# Patient Record
Sex: Female | Born: 2000 | Hispanic: No | Marital: Single | Smoking: Never smoker
Health system: Southern US, Community
[De-identification: ages and names within clinical notes are randomized; demographics above are authoritative.]

## PROBLEM LIST (undated history)

## (undated) DIAGNOSIS — F419 Anxiety disorder, unspecified: Secondary | ICD-10-CM

## (undated) DIAGNOSIS — E781 Pure hyperglyceridemia: Secondary | ICD-10-CM

## (undated) DIAGNOSIS — E860 Dehydration: Secondary | ICD-10-CM

## (undated) HISTORY — DX: Pure hyperglyceridemia: E78.1

## (undated) HISTORY — DX: Anxiety disorder, unspecified: F41.9

## (undated) HISTORY — DX: Dehydration: E86.0

---
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
## 2023-02-21 ENCOUNTER — Encounter: Payer: Self-pay | Admitting: Family Medicine

## 2023-02-21 ENCOUNTER — Ambulatory Visit (INDEPENDENT_AMBULATORY_CARE_PROVIDER_SITE_OTHER): Payer: Self-pay | Admitting: Family Medicine

## 2023-02-21 VITALS — BP 106/69 | HR 76 | Ht 64.0 in | Wt 151.0 lb

## 2023-02-21 DIAGNOSIS — R7301 Impaired fasting glucose: Secondary | ICD-10-CM

## 2023-02-21 DIAGNOSIS — E039 Hypothyroidism, unspecified: Secondary | ICD-10-CM

## 2023-02-21 DIAGNOSIS — Z1159 Encounter for screening for other viral diseases: Secondary | ICD-10-CM

## 2023-02-21 DIAGNOSIS — Z114 Encounter for screening for human immunodeficiency virus [HIV]: Secondary | ICD-10-CM

## 2023-02-21 DIAGNOSIS — F419 Anxiety disorder, unspecified: Secondary | ICD-10-CM | POA: Insufficient documentation

## 2023-02-21 DIAGNOSIS — E785 Hyperlipidemia, unspecified: Secondary | ICD-10-CM

## 2023-02-21 MED ORDER — ESCITALOPRAM OXALATE 5 MG PO TABS: 5.0000 mg | ORAL_TABLET | Freq: Every day | ORAL | 3 refills | Status: DC

## 2023-02-21 MED ORDER — HYDROXYZINE HCL 10 MG PO TABS: 10.0000 mg | ORAL_TABLET | Freq: Three times a day (TID) | ORAL | 1 refills | Status: DC | PRN

## 2023-02-21 NOTE — Patient Instructions (Signed)
It was pleasure meeting with you today. Please take medications as prescribed. Follow up with your primary health provider if any health concerns arises. If symptoms worsen please contact your primary care provider and/or visit the emergency department.  

## 2023-02-21 NOTE — Assessment & Plan Note (Addendum)
    02/21/2023    2:17 PM  GAD 7 : Generalized Anxiety Score  Nervous, Anxious, on Edge 1  Control/stop worrying 1  Worry too much - different things 1  Trouble relaxing 0  Restless 1  Easily annoyed or irritable 1  Afraid - awful might happen 0  Total GAD 7 Score 5  Anxiety Difficulty Somewhat difficult  Prescribed Lexapro 5 mg daily as initial therapy and hydrxyzine 10 mg PRN Discussed about cognitive behavioral therapy focusing on thoughts, belief, and attitudes that affects feelings and behavior, learning about coping skills to deal with certain problems. Maintaining a consistent routine and schedule, Practice stress management and self calming techniques, excersise regularly and spend time outdoors, Do not eat food that are high in fat, added sugar, or salt. Follow up in 6 weeks Patient requested Referral to behavior health

## 2023-02-21 NOTE — Progress Notes (Signed)
New Patient Office Visit   Subjective   Patient ID: Linda Russell, female    DOB: 24-Apr-2001  Age: 22 y.o. MRN: [id number]  CC:  Chief Complaint  Patient presents with   Establish Care    HPI [facility] 22 year old female, presents to establish care. She  has no past medical history on file.  Anxiety Onset was 1 to 5 years ago. The problem has been gradually worsening. Symptoms include decreased concentration, depressed mood, excessive worry, insomnia, irritability and nervous/anxious behavior. Patient reports no chest pain, dizziness, panic, shortness of breath or suicidal ideas. Symptoms occur most days. The severity of symptoms is moderate. The symptoms are aggravated by family issues, work stress and social activities. The patient sleeps 7 hours per night. The quality of sleep is fair. Nighttime awakenings: none. Risk factors include family history. Her past medical history is significant for anxiety/panic attacks. Patient reports no prior medication intervention.    Outpatient Encounter Medications as of 02/21/2023  Medication Sig   escitalopram (LEXAPRO) 5 MG tablet Take 1 tablet (5 mg total) by mouth daily.   hydrOXYzine (ATARAX) 10 MG tablet Take 1 tablet (10 mg total) by mouth 3 (three) times daily as needed.   No facility-administered encounter medications on file as of 02/21/2023.    History reviewed. No pertinent surgical history.  Review of Systems  Constitutional:  Negative for chills and fever.  Respiratory:  Negative for cough.   Musculoskeletal:  Negative for myalgias.  Neurological:  Negative for headaches.      Objective    BP 106/69   Pulse 76   Ht '5\' 4"'$  (1.626 m)   Wt 151 lb (68.5 kg)   SpO2 98%   BMI 25.92 kg/m   Physical Exam Cardiovascular:     Rate and Rhythm: Normal rate and regular rhythm.     Pulses: Normal pulses.     Heart sounds: Normal heart sounds.  Pulmonary:     Effort: Pulmonary effort is normal. No  respiratory distress.     Breath sounds: Normal breath sounds.  Musculoskeletal:        General: Normal range of motion.     Cervical back: Normal range of motion and neck supple.  Skin:    General: Skin is warm and dry.     Capillary Refill: Capillary refill takes less than 2 seconds.  Neurological:     General: No focal deficit present.     Mental Status: She is alert.     Coordination: Coordination normal.     Gait: Gait normal.  Psychiatric:        Mood and Affect: Mood normal.        Thought Content: Thought content normal.       Assessment & Plan:  Anxiety Assessment & Plan:    02/21/2023    2:17 PM  GAD 7 : Generalized Anxiety Score  Nervous, Anxious, on Edge 1  Control/stop worrying 1  Worry too much - different things 1  Trouble relaxing 0  Restless 1  Easily annoyed or irritable 1  Afraid - awful might happen 0  Total GAD 7 Score 5  Anxiety Difficulty Somewhat difficult  Prescribed Lexapro 5 mg daily as initial therapy and hydrxyzine 10 mg PRN Discussed about cognitive behavioral therapy focusing on thoughts, belief, and attitudes that affects feelings and behavior, learning about coping skills to deal with certain problems. Maintaining a consistent routine and schedule, Practice stress management and self  calming techniques, excersise regularly and spend time outdoors, Do not eat food that are high in fat, added sugar, or salt. Follow up in 6 weeks Patient requested Referral to behavior health    Orders: -     Escitalopram Oxalate; Take 1 tablet (5 mg total) by mouth daily.  Dispense: 30 tablet; Refill: 3 -     hydrOXYzine HCl; Take 1 tablet (10 mg total) by mouth 3 (three) times daily as needed.  Dispense: 30 tablet; Refill: 1 -     Amb ref to Integrated Behavioral Health  Need for hepatitis C screening test -     Hepatitis C antibody  Screening for HIV (human immunodeficiency virus) -     HIV Antibody (routine testing w rflx)  Hypothyroidism,  unspecified type -     TSH + free T4  Hyperlipidemia, unspecified hyperlipidemia type -     Lipid panel -     CMP14+EGFR -     CBC with Differential/Platelet  IFG (impaired fasting glucose) -     Hemoglobin A1c    Return in about 6 weeks (around 04/04/2023) for Anxiety.   Renard Hamper Ria Comment, FNP

## 2023-02-22 LAB — LIPID PANEL
Chol/HDL Ratio: 3.7 ratio (ref 0.0–4.4)
Cholesterol, Total: 164 mg/dL (ref 100–169)
HDL: 44 mg/dL (ref >39)
LDL Chol Calc (NIH): 102 mg/dL (ref 0–109)
Triglycerides: 100 mg/dL — ABNORMAL HIGH (ref 0–89)
VLDL Cholesterol Cal: 18 mg/dL (ref 5–40)

## 2023-02-22 LAB — CBC WITH DIFFERENTIAL/PLATELET
Basophils Absolute: 0.1 10*3/uL (ref 0.0–0.2)
Basos: 1 %
EOS (ABSOLUTE): 0.1 10*3/uL (ref 0.0–0.4)
Eos: 1 %
Hematocrit: 44.6 % (ref 34.0–46.6)
Hemoglobin: 14.6 g/dL (ref 11.1–15.9)
Immature Grans (Abs): 0 10*3/uL (ref 0.0–0.1)
Immature Granulocytes: 0 %
Lymphocytes Absolute: 2.7 10*3/uL (ref 0.7–3.1)
Lymphs: 30 %
MCH: 27.8 pg (ref 26.6–33.0)
MCHC: 32.7 g/dL (ref 31.5–35.7)
MCV: 85 fL (ref 79–97)
Monocytes Absolute: 0.5 10*3/uL (ref 0.1–0.9)
Monocytes: 6 %
Neutrophils Absolute: 5.4 10*3/uL (ref 1.4–7.0)
Neutrophils: 62 %
Platelets: 401 10*3/uL (ref 150–450)
RBC: 5.25 x10E6/uL (ref 3.77–5.28)
RDW: 12.7 % (ref 11.7–15.4)
WBC: 8.7 10*3/uL (ref 3.4–10.8)

## 2023-02-22 LAB — CMP14+EGFR
ALT: 23 IU/L (ref 0–32)
AST: 19 IU/L (ref 0–40)
Albumin/Globulin Ratio: 2 (ref 1.2–2.2)
Albumin: 5.1 g/dL — ABNORMAL HIGH (ref 4.0–5.0)
Alkaline Phosphatase: 101 IU/L (ref 42–106)
BUN/Creatinine Ratio: 8 — ABNORMAL LOW (ref 9–23)
BUN: 5 mg/dL — ABNORMAL LOW (ref 6–20)
Bilirubin Total: 0.3 mg/dL (ref 0.0–1.2)
CO2: 23 mmol/L (ref 20–29)
Calcium: 10 mg/dL (ref 8.7–10.2)
Chloride: 101 mmol/L (ref 96–106)
Creatinine, Ser: 0.6 mg/dL (ref 0.57–1.00)
Globulin, Total: 2.5 g/dL (ref 1.5–4.5)
Glucose: 92 mg/dL (ref 70–99)
Potassium: 4.9 mmol/L (ref 3.5–5.2)
Sodium: 139 mmol/L (ref 134–144)
Total Protein: 7.6 g/dL (ref 6.0–8.5)
eGFR: 133 mL/min/{1.73_m2} (ref >59)

## 2023-02-22 LAB — HEPATITIS C ANTIBODY: Hep C Virus Ab: NONREACTIVE

## 2023-02-22 LAB — TSH+FREE T4
Free T4: 1.38 ng/dL (ref 0.93–1.60)
TSH: 0.802 u[IU]/mL (ref 0.450–4.500)

## 2023-02-22 LAB — HEMOGLOBIN A1C
Est. average glucose Bld gHb Est-mCnc: 105 mg/dL
Hgb A1c MFr Bld: 5.3 % (ref 4.8–5.6)

## 2023-02-22 LAB — HIV ANTIBODY (ROUTINE TESTING W REFLEX): HIV Screen 4th Generation wRfx: NONREACTIVE

## 2023-03-15 ENCOUNTER — Ambulatory Visit (INDEPENDENT_AMBULATORY_CARE_PROVIDER_SITE_OTHER): Payer: Medicaid Other | Admitting: Licensed Clinical Social Worker

## 2023-03-15 DIAGNOSIS — F419 Anxiety disorder, unspecified: Secondary | ICD-10-CM

## 2023-03-19 NOTE — BH Specialist Note (Signed)
Integrated Behavioral Health via Telemedicine Visit  03/19/2023 Linda Russell [id number]  Number of Integrated Behavioral Health Clinician visits: 1 Session Start time:  10:30am Session End time: 11:03am Total time in minutes: 33 mins via mychart video   Referring Provider: Noberto Retort NP  Patient/Family location: Home  [facility] Provider location: [facility]  All persons participating in visit: Linda A Torres LCSW A Felton Clinton  Types of Service: General Behavioral Integrated Care (BHI)  I connected with Linda Russell and/or Linda Russell's n/a via  Telephone or Engineer, civil (consulting)  (Video is Surveyor, mining) and verified that I am speaking with the correct person using two identifiers. Discussed confidentiality: Yes   I discussed the limitations of telemedicine and the availability of in person appointments.  Discussed there is a possibility of technology failure and discussed alternative modes of communication if that failure occurs.  I discussed that engaging in this telemedicine visit, they consent to the provision of behavioral healthcare and the services will be billed under their insurance.  Patient and/or legal guardian expressed understanding and consented to Telemedicine visit: Yes   Presenting Concerns: Patient and/or family reports the following symptoms/concerns: general anxiety disorder Duration of problem: over one year ; Severity of problem: mild  Patient and/or Family's Strengths/Protective Factors: Concrete supports in place (healthy food, safe environments, etc.)  Goals Addressed: Patient will:  Reduce symptoms of: anxiety   Increase knowledge and/or ability of: coping skills   Demonstrate ability to: Increase healthy adjustment to current life circumstances  Progress towards Goals: Ongoing  Interventions: Interventions utilized:  Motivational Interviewing and Supportive Counseling Standardized Assessments  completed: n/a  Patient and/or Family Response: Ms. Linda Russell reports social isolation, depressed mood, difficulty starting tasks,  and low motivation.    Assessment: Patient currently experiencing anxiety.   Patient may benefit from integrated behavioral health.  Plan: Follow up with behavioral health clinician on : 2-3 weeks Behavioral recommendations: engage in peer and social groups, write down and prioritize tasks.  Referral(s): Integrated [employer] (In Clinic)  I discussed the assessment and treatment plan with the patient and/or parent/guardian. They were provided an opportunity to ask questions and all were answered. They agreed with the plan and demonstrated an understanding of the instructions.   They were advised to call back or seek an in-person evaluation if the symptoms worsen or if the condition fails to improve as anticipated.  Linda Saxon, LCSW

## 2023-04-04 ENCOUNTER — Ambulatory Visit: Payer: Medicaid Other | Admitting: Family Medicine

## 2023-04-04 ENCOUNTER — Encounter: Payer: Self-pay | Admitting: Family Medicine

## 2023-04-04 VITALS — BP 110/76 | HR 95 | Ht 64.0 in | Wt 152.0 lb

## 2023-04-04 DIAGNOSIS — F419 Anxiety disorder, unspecified: Secondary | ICD-10-CM | POA: Diagnosis not present

## 2023-04-04 MED ORDER — ESCITALOPRAM OXALATE 10 MG PO TABS: 10.0000 mg | ORAL_TABLET | Freq: Every day | ORAL | 2 refills | Status: DC

## 2023-04-04 MED ORDER — HYDROXYZINE PAMOATE 25 MG PO CAPS: 25.0000 mg | ORAL_CAPSULE | Freq: Three times a day (TID) | ORAL | 0 refills | Status: AC | PRN

## 2023-04-04 NOTE — Progress Notes (Signed)
Patient Office Visit   Subjective   Patient ID: Linda Russell, female    DOB: 2001-06-09  Age: 22 y.o. MRN: [id number]  CC:  Chief Complaint  Patient presents with   Anxiety    Unchanged.     HPI Linda Russell 22 year old female, presents to to the clinic for anxiety follow up. She  has a past medical history of Anxiety, Dehydration, and High triglycerides.For the details of today's visit, please refer to assessment and plan.   HPI    Outpatient Encounter Medications as of 04/04/2023  Medication Sig   escitalopram (LEXAPRO) 10 MG tablet Take 1 tablet (10 mg total) by mouth daily.   hydrOXYzine (VISTARIL) 25 MG capsule Take 1 capsule (25 mg total) by mouth every 8 (eight) hours as needed.   [DISCONTINUED] escitalopram (LEXAPRO) 5 MG tablet Take 1 tablet (5 mg total) by mouth daily.   [DISCONTINUED] hydrOXYzine (ATARAX) 10 MG tablet Take 1 tablet (10 mg total) by mouth 3 (three) times daily as needed.   No facility-administered encounter medications on file as of 04/04/2023.    History reviewed. No pertinent surgical history.  Review of Systems  Constitutional:  Negative for chills and fever.  HENT:  Negative for hearing loss.   Respiratory:  Negative for cough.   Cardiovascular:  Negative for chest pain.  Gastrointestinal:  Negative for abdominal pain and vomiting.  Genitourinary:  Negative for dysuria.  Musculoskeletal:  Negative for myalgias.  Neurological:  Negative for dizziness and headaches.  Psychiatric/Behavioral:  The patient is nervous/anxious and has insomnia.       Objective    BP 110/76   Pulse 95   Ht  (1.626 m)   Wt 152 lb (68.9 kg)   SpO2 97%   BMI 26.09 kg/m   Physical Exam Vitals reviewed.  Constitutional:      General: She is not in acute distress.    Appearance: Normal appearance. She is not ill-appearing, toxic-appearing or diaphoretic.  HENT:     Head: Normocephalic.  Eyes:     General:        Right eye: No  discharge.        Left eye: No discharge.     Conjunctiva/sclera: Conjunctivae normal.  Cardiovascular:     Rate and Rhythm: Normal rate.     Pulses: Normal pulses.     Heart sounds: Normal heart sounds.  Pulmonary:     Effort: Pulmonary effort is normal. No respiratory distress.     Breath sounds: Normal breath sounds.  Abdominal:     General: Bowel sounds are normal.     Palpations: Abdomen is soft.     Tenderness: There is no abdominal tenderness. There is no right CVA tenderness, left CVA tenderness or guarding.  Musculoskeletal:        General: Normal range of motion.     Cervical back: Normal range of motion.  Skin:    General: Skin is warm and dry.     Capillary Refill: Capillary refill takes less than 2 seconds.  Neurological:     General: No focal deficit present.     Mental Status: She is alert and oriented to person, place, and time.     Coordination: Coordination normal.     Gait: Gait normal.  Psychiatric:        Mood and Affect: Mood normal.        Behavior: Behavior normal.  Thought Content: Thought content normal.        Judgment: Judgment normal.       Assessment & Plan:  Anxiety Assessment & Plan:    04/04/2023    2:02 PM 02/21/2023    2:17 PM  GAD 7 : Generalized Anxiety Score  Nervous, Anxious, on Edge 1 1  Control/stop worrying 0 1  Worry too much - different things 1 1  Trouble relaxing 0 0  Restless 1 1  Easily annoyed or irritable 1 1  Afraid - awful might happen 0 0  Total GAD 7 Score 4 5  Anxiety Difficulty  Somewhat difficult  Patient reported anxiety medication not working  Increased Lexapro 10 mg and Hydroxyzine 25 mg PRN  Follow up in 6 weeks Continued discussion on lifestyle changes, establishing a daily routine, going outdoors, exercise, healthy eating habits, mindfulness and mediatation.     Orders: -     Escitalopram Oxalate; Take 1 tablet (10 mg total) by mouth daily.  Dispense: 30 tablet; Refill: 2 -     hydrOXYzine  Pamoate; Take 1 capsule (25 mg total) by mouth every 8 (eight) hours as needed.  Dispense: 30 capsule; Refill: 0    Return in about 6 weeks (around 05/16/2023) for Anxiety.   Cruzita Lederer Newman Nip, FNP

## 2023-04-04 NOTE — Patient Instructions (Signed)

## 2023-04-04 NOTE — Assessment & Plan Note (Addendum)
    04/04/2023    2:02 PM 02/21/2023    2:17 PM  GAD 7 : Generalized Anxiety Score  Nervous, Anxious, on Edge 1 1  Control/stop worrying 0 1  Worry too much - different things 1 1  Trouble relaxing 0 0  Restless 1 1  Easily annoyed or irritable 1 1  Afraid - awful might happen 0 0  Total GAD 7 Score 4 5  Anxiety Difficulty  Somewhat difficult  Patient reported anxiety medication not working  Increased Lexapro 10 mg and Hydroxyzine 25 mg PRN  Follow up in 6 weeks Continued discussion on lifestyle changes, establishing a daily routine, going outdoors, exercise, healthy eating habits, mindfulness and mediatation.

## 2023-05-10 ENCOUNTER — Telehealth: Payer: Self-pay

## 2023-05-10 NOTE — Telephone Encounter (Signed)
LVM, also sending mychart msg. AS, CMA 

## 2023-05-16 ENCOUNTER — Encounter: Payer: Self-pay | Admitting: Family Medicine

## 2023-05-16 ENCOUNTER — Ambulatory Visit: Payer: Medicaid Other | Admitting: Family Medicine

## 2023-05-16 VITALS — BP 110/64 | HR 103 | Ht 64.0 in | Wt 158.0 lb

## 2023-05-16 DIAGNOSIS — F339 Major depressive disorder, recurrent, unspecified: Secondary | ICD-10-CM

## 2023-05-16 DIAGNOSIS — F419 Anxiety disorder, unspecified: Secondary | ICD-10-CM

## 2023-05-16 DIAGNOSIS — R5383 Other fatigue: Secondary | ICD-10-CM

## 2023-05-16 MED ORDER — ESCITALOPRAM OXALATE 20 MG PO TABS: 20.0000 mg | ORAL_TABLET | Freq: Every day | ORAL | 2 refills | Status: DC

## 2023-05-16 MED ORDER — BUPROPION HCL ER (XL) 150 MG PO TB24: 150.0000 mg | ORAL_TABLET | Freq: Every day | ORAL | 2 refills | Status: DC

## 2023-05-16 NOTE — Progress Notes (Signed)
Patient Office Visit   Subjective   Patient ID: Linda Russell, female    DOB: [birth year]  Age: 22 y.o. MRN: [id number]  CC:  Chief Complaint  Patient presents with   Anxiety    Patient is here for anxiety f/u. States she feels the same since last visit.     HPI Linda Russell 22 year old female, presents to the clinic for follow up anxiety and depression. She  has a past medical history of Anxiety, Dehydration, and High triglycerides.For the details of today's visit, please refer to assessment and plan.   HPI    Outpatient Encounter Medications as of 05/16/2023  Medication Sig   buPROPion (WELLBUTRIN XL) 150 MG 24 hr tablet Take 1 tablet (150 mg total) by mouth daily.   escitalopram (LEXAPRO) 20 MG tablet Take 1 tablet (20 mg total) by mouth daily.   hydrOXYzine (VISTARIL) 25 MG capsule Take 1 capsule (25 mg total) by mouth every 8 (eight) hours as needed.   [DISCONTINUED] escitalopram (LEXAPRO) 10 MG tablet Take 1 tablet (10 mg total) by mouth daily.   No facility-administered encounter medications on file as of 05/16/2023.    No past surgical history on file.  Review of Systems  Constitutional:  Negative for chills and fever.  Eyes:  Negative for blurred vision.  Respiratory:  Negative for shortness of breath.   Cardiovascular:  Negative for chest pain.  Gastrointestinal:  Negative for abdominal pain.  Genitourinary:  Negative for dysuria.  Neurological:  Negative for dizziness.  Psychiatric/Behavioral:  Positive for depression. The patient is nervous/anxious.       Objective    BP 110/64   Pulse (!) 103   Ht 5\' 4"  (1.626 m)   Wt 158 lb (71.7 kg)   SpO2 96%   BMI 27.12 kg/m   Physical Exam Vitals reviewed.  Constitutional:      General: She is not in acute distress.    Appearance: Normal appearance. She is not ill-appearing, toxic-appearing or diaphoretic.  HENT:     Head: Normocephalic.  Eyes:     General:        Right eye: No  discharge.        Left eye: No discharge.     Conjunctiva/sclera: Conjunctivae normal.  Cardiovascular:     Rate and Rhythm: Normal rate.     Pulses: Normal pulses.     Heart sounds: Normal heart sounds.  Pulmonary:     Effort: Pulmonary effort is normal. No respiratory distress.     Breath sounds: Normal breath sounds.  Musculoskeletal:        General: Normal range of motion.     Cervical back: Normal range of motion.  Skin:    General: Skin is warm and dry.     Capillary Refill: Capillary refill takes less than 2 seconds.  Neurological:     General: No focal deficit present.     Mental Status: She is alert and oriented to person, place, and time.     Coordination: Coordination normal.     Gait: Gait normal.  Psychiatric:        Mood and Affect: Mood normal.        Behavior: Behavior normal.       Assessment & Plan:  Other fatigue -     VITAMIN D 25 Hydroxy (Vit-D Deficiency, Fractures) -     Vitamin B12 -     Folate  Depression, recurrent (HCC) Assessment & Plan:  Flowsheet Row Office Visit from 05/16/2023 in [facility]  PHQ-9 Total Score 9     Started Wellbutrin 150 mg daily Discussed non pharmacological interventions such seeing a therapist, support system, diet, exercise, sleep. Patient verbalizes understanding regarding plan of care and all questions answered.    Anxiety Assessment & Plan:    05/16/2023   10:56 AM 04/04/2023    2:02 PM 02/21/2023    2:17 PM  GAD 7 : Generalized Anxiety Score  Nervous, Anxious, on Edge 2 1 1   Control/stop worrying 0 0 1  Worry too much - different things 1 1 1   Trouble relaxing 0 0 0  Restless 2 1 1   Easily annoyed or irritable 1 1 1   Afraid - awful might happen 1 0 0  Total GAD 7 Score 7 4 5   Anxiety Difficulty Somewhat difficult  Somewhat difficult  Patient reported has not seen a difference for her anxiety symptoms reported taking lexapro 10 mg daily  Increased lexapro 20 mg daily Discussed about  cognitive behavioral therapy focusing on thoughts, belief, and attitudes that affects feelings and behavior, learning about coping skills to deal with certain problems. Maintaining a consistent routine and schedule, Practice stress management and self calming techniques, excersise regularly and spend time outdoors, Do not eat food that are high in fat, added sugar, or salt. Follow up in 8 weeks    Other orders -     Escitalopram Oxalate; Take 1 tablet (20 mg total) by mouth daily.  Dispense: 30 tablet; Refill: 2 -     buPROPion HCl ER (XL); Take 1 tablet (150 mg total) by mouth daily.  Dispense: 30 tablet; Refill: 2    Return in about 8 weeks (around 07/11/2023) for Anxiety.   Cruzita Lederer Newman Nip, FNP

## 2023-05-16 NOTE — Assessment & Plan Note (Signed)
    05/16/2023   10:56 AM 04/04/2023    2:02 PM 02/21/2023    2:17 PM  GAD 7 : Generalized Anxiety Score  Nervous, Anxious, on Edge 2 1 1   Control/stop worrying 0 0 1  Worry too much - different things 1 1 1   Trouble relaxing 0 0 0  Restless 2 1 1   Easily annoyed or irritable 1 1 1   Afraid - awful might happen 1 0 0  Total GAD 7 Score 7 4 5   Anxiety Difficulty Somewhat difficult  Somewhat difficult  Patient reported has not seen a difference for her anxiety symptoms reported taking lexapro 10 mg daily  Increased lexapro 20 mg daily Discussed about cognitive behavioral therapy focusing on thoughts, belief, and attitudes that affects feelings and behavior, learning about coping skills to deal with certain problems. Maintaining a consistent routine and schedule, Practice stress management and self calming techniques, excersise regularly and spend time outdoors, Do not eat food that are high in fat, added sugar, or salt. Follow up in 8 weeks

## 2023-05-16 NOTE — Patient Instructions (Addendum)
        Great to see you today.   - Please take medications as prescribed. - Follow up with your primary health provider if any health concerns arises. - If symptoms worsen please contact your primary care provider and/or visit the emergency department.  

## 2023-05-16 NOTE — Assessment & Plan Note (Addendum)
Flowsheet Row Office Visit from 05/16/2023 in [facility]  PHQ-9 Total Score 9     Started Wellbutrin 150 mg daily Discussed non pharmacological interventions such seeing a therapist, support system, diet, exercise, sleep. Patient verbalizes understanding regarding plan of care and all questions answered.

## 2023-05-17 LAB — VITAMIN B12: Vitamin B-12: 521 pg/mL (ref 232–1245)

## 2023-05-17 LAB — VITAMIN D 25 HYDROXY (VIT D DEFICIENCY, FRACTURES): Vit D, 25-Hydroxy: 13.6 ng/mL — ABNORMAL LOW (ref 30.0–100.0)

## 2023-05-17 LAB — FOLATE: Folate: 12.2 ng/mL (ref >3.0)

## 2023-06-25 ENCOUNTER — Encounter: Payer: Medicaid Other | Admitting: Family Medicine

## 2023-07-11 ENCOUNTER — Encounter: Payer: Self-pay | Admitting: Family Medicine

## 2023-07-11 ENCOUNTER — Ambulatory Visit: Payer: Medicaid Other | Admitting: Family Medicine

## 2023-07-11 VITALS — BP 116/68 | HR 100 | Ht 64.0 in | Wt 152.0 lb

## 2023-07-11 DIAGNOSIS — F419 Anxiety disorder, unspecified: Secondary | ICD-10-CM | POA: Diagnosis not present

## 2023-07-11 DIAGNOSIS — F339 Major depressive disorder, recurrent, unspecified: Secondary | ICD-10-CM

## 2023-07-11 DIAGNOSIS — L509 Urticaria, unspecified: Secondary | ICD-10-CM | POA: Insufficient documentation

## 2023-07-11 MED ORDER — EPINEPHRINE 0.3 MG/0.3ML IJ SOAJ: 0.3000 mg | INTRAMUSCULAR | 3 refills | Status: AC | PRN

## 2023-07-11 NOTE — Assessment & Plan Note (Addendum)
Patient reported Hives with swelling and itchiness several episodes around 3-4 times a year. Last episode in July 2024 - Unknown Etiology Patient stated is unaware if she has allergies to any substance. Referral to Allergy Services Advice Epi-Pen PRN if warning symptoms of SOB, chest pain, swelling, dizziness.

## 2023-07-11 NOTE — Assessment & Plan Note (Addendum)
Flowsheet Row Office Visit from 07/11/2023 in [facility]  PHQ-9 Total Score 10     Patient reports symptoms controlled on Wellbutrin 150 mg daily Continued discussion on lifestyle changes, establishing a daily routine, going outdoors, exercise, healthy eating habits, mindfulness and mediatation.

## 2023-07-11 NOTE — Patient Instructions (Signed)
        Great to see you today.   - Please take medications as prescribed. - Follow up with your primary health provider if any health concerns arises. - If symptoms worsen please contact your primary care provider and/or visit the emergency department.  

## 2023-07-11 NOTE — Assessment & Plan Note (Addendum)
    07/11/2023    9:25 AM 05/16/2023   10:56 AM 04/04/2023    2:02 PM 02/21/2023    2:17 PM  GAD 7 : Generalized Anxiety Score  Nervous, Anxious, on Edge 2 2 1 1   Control/stop worrying 1 0 0 1  Worry too much - different things 1 1 1 1   Trouble relaxing 2 0 0 0  Restless 2 2 1 1   Easily annoyed or irritable 1 1 1 1   Afraid - awful might happen 0 1 0 0  Total GAD 7 Score 9 7 4 5   Anxiety Difficulty Somewhat difficult Somewhat difficult  Somewhat difficult  Patient reported symptoms controlled on Lexapro 20 mg once daily. Continue current medication regimen

## 2023-07-11 NOTE — Progress Notes (Signed)
Patient Office Visit   Subjective   Patient ID: Linda Russell, female    DOB: [birth year]  Age: 22 y.o. MRN: [id number]  CC:  Chief Complaint  Patient presents with   Depression   Anxiety    Patient is here for f/u of anxiety and depression. No changes or concerns since last visit.     HPI Linda Russell 22 year old female, presents to the clinic for anxiety and depression follow up. She  has a past medical history of Anxiety, Dehydration, and High triglycerides.For the details of today's visit, please refer to assessment and plan.   HPI    Outpatient Encounter Medications as of 07/11/2023  Medication Sig   buPROPion (WELLBUTRIN XL) 150 MG 24 hr tablet Take 1 tablet (150 mg total) by mouth daily.   EPINEPHrine 0.3 mg/0.3 mL IJ SOAJ injection Inject 0.3 mg into the muscle as needed for anaphylaxis.   escitalopram (LEXAPRO) 20 MG tablet Take 1 tablet (20 mg total) by mouth daily.   hydrOXYzine (VISTARIL) 25 MG capsule Take 1 capsule (25 mg total) by mouth every 8 (eight) hours as needed.   No facility-administered encounter medications on file as of 07/11/2023.    No past surgical history on file.  Review of Systems  Constitutional:  Negative for chills and fever.  Eyes:  Negative for blurred vision.  Respiratory:  Negative for shortness of breath.   Cardiovascular:  Negative for chest pain.  Gastrointestinal:  Negative for abdominal pain.  Neurological:  Negative for headaches.      Objective    BP 116/68   Pulse 100   Ht 5\' 4"  (1.626 m)   Wt 152 lb (68.9 kg)   SpO2 96%   BMI 26.09 kg/m   Physical Exam Vitals reviewed.  Constitutional:      General: She is not in acute distress.    Appearance: Normal appearance. She is not ill-appearing, toxic-appearing or diaphoretic.  HENT:     Head: Normocephalic.  Eyes:     General:        Right eye: No discharge.        Left eye: No discharge.     Conjunctiva/sclera: Conjunctivae normal.   Cardiovascular:     Pulses: Normal pulses.     Heart sounds: Normal heart sounds.  Pulmonary:     Effort: Pulmonary effort is normal. No respiratory distress.     Breath sounds: Normal breath sounds.  Musculoskeletal:        General: Normal range of motion.     Cervical back: Normal range of motion.  Skin:    General: Skin is warm and dry.     Capillary Refill: Capillary refill takes less than 2 seconds.  Neurological:     General: No focal deficit present.     Mental Status: She is alert and oriented to person, place, and time.     Coordination: Coordination normal.     Gait: Gait normal.  Psychiatric:        Mood and Affect: Mood normal.        Behavior: Behavior normal.       Assessment & Plan:  Depression, recurrent [facility]) Assessment & Plan: Flowsheet Row Office Visit from 07/11/2023 in [facility]  PHQ-9 Total Score 10     Patient reports symptoms controlled on Wellbutrin 150 mg daily Continued discussion on lifestyle changes, establishing a daily routine, going outdoors, exercise, healthy eating habits, mindfulness and mediatation.  Anxiety Assessment & Plan:    07/11/2023    9:25 AM 05/16/2023   10:56 AM 04/04/2023    2:02 PM 02/21/2023    2:17 PM  GAD 7 : Generalized Anxiety Score  Nervous, Anxious, on Edge 2 2 1 1   Control/stop worrying 1 0 0 1  Worry too much - different things 1 1 1 1   Trouble relaxing 2 0 0 0  Restless 2 2 1 1   Easily annoyed or irritable 1 1 1 1   Afraid - awful might happen 0 1 0 0  Total GAD 7 Score 9 7 4 5   Anxiety Difficulty Somewhat difficult Somewhat difficult  Somewhat difficult  Patient reported symptoms controlled on Lexapro 20 mg once daily. Continue current medication regimen      Hives Assessment & Plan: Patient reported Hives with swelling and itchiness several episodes around 3-4 times a year. Last episode in July 2024 - Unknown Etiology Patient stated is unaware if she has allergies to any  substance. Referral to Allergy Services Advice Epi-Pen PRN if warning symptoms of SOB, chest pain, swelling, dizziness.   Orders: -     Ambulatory referral to Allergy -     EPINEPHrine; Inject 0.3 mg into the muscle as needed for anaphylaxis.  Dispense: 1 each; Refill: 3    Return in about 4 months (around 11/10/2023), or if symptoms worsen or fail to improve, for Anxiety, Depression, medication managment.   Cruzita Lederer Newman Nip, FNP

## 2023-07-26 ENCOUNTER — Ambulatory Visit: Payer: Medicaid Other | Admitting: Family Medicine

## 2023-07-26 ENCOUNTER — Encounter: Payer: Self-pay | Admitting: Family Medicine

## 2023-08-27 ENCOUNTER — Other Ambulatory Visit: Payer: Self-pay

## 2023-08-27 ENCOUNTER — Ambulatory Visit: Payer: Medicaid Other | Admitting: Internal Medicine

## 2023-08-27 ENCOUNTER — Encounter: Payer: Self-pay | Admitting: Internal Medicine

## 2023-08-27 VITALS — BP 110/66 | HR 100 | Temp 98.2°F | Resp 18 | Ht 62.72 in | Wt 150.2 lb

## 2023-08-27 DIAGNOSIS — L500 Allergic urticaria: Secondary | ICD-10-CM | POA: Diagnosis not present

## 2023-08-27 DIAGNOSIS — J3089 Other allergic rhinitis: Secondary | ICD-10-CM

## 2023-08-27 DIAGNOSIS — J301 Allergic rhinitis due to pollen: Secondary | ICD-10-CM

## 2023-08-27 DIAGNOSIS — T781XXA Other adverse food reactions, not elsewhere classified, initial encounter: Secondary | ICD-10-CM | POA: Diagnosis not present

## 2023-08-27 MED ORDER — AZELASTINE HCL 0.1 % NA SOLN: 1.0000 | Freq: Two times a day (BID) | NASAL | 5 refills | Status: DC | PRN

## 2023-08-27 MED ORDER — CETIRIZINE HCL 10 MG PO TABS: 10.0000 mg | ORAL_TABLET | Freq: Two times a day (BID) | ORAL | 5 refills | Status: DC | PRN

## 2023-08-27 MED ORDER — FAMOTIDINE 20 MG PO TABS: 20.0000 mg | ORAL_TABLET | Freq: Two times a day (BID) | ORAL | 5 refills | Status: AC | PRN

## 2023-08-27 NOTE — Progress Notes (Signed)
NEW PATIENT  Date of Service/Encounter:  08/27/23  Consult requested by: Rica Records, FNP   Subjective:   Linda Russell (DOB: [birth year]) is a 22 y.o. female who presents to the clinic on 08/27/2023 with a chief complaint of Urticaria (Says she was interacting with her pets after they came from outside and she began itching and hiving up even after showering. She is unsure if the pets brought anything inside from being outside. ) .    History obtained from: chart review and patient.   Hives: Started 2018-2019. Rash is itchy, raised, red. Mostly on arms and legs and sometimes puffiness of cheeks. No new meds, products, foods.  Occurs when she gets exposed to pets and thinks its something they bring it from outside.  Occurs with both cats/dogs. She showers after exposure to pets but still persists for days after. Occurs randomly sometimes also though, not always with pet exposure. Occurs about 3-4 times a year; lasts about 1-3 days.   Does not take medications.   Rhinitis:  Started about a year ago.  Symptoms include: nasal congestion, rhinorrhea, and post nasal drainage  Occurs seasonally-Spring/Fall Potential triggers: not sure  Treatments tried:  PRN Claritin;  last use was a week ago  Previous allergy testing: no History of sinus surgery: no Nonallergic triggers: none    Food Reactions: Reports fresh bananas cause mouth itching. Cooked forms she has no trouble with. No other GI, respiratory, cutaneous symptoms.   Past Medical History: Past Medical History:  Diagnosis Date   Anxiety    Dehydration    High triglycerides      Past Surgical History: No past surgical history on file.  Family History: Family History  Problem Relation Age of Onset   Depression Mother    Anxiety disorder Mother    Diabetes Maternal Grandmother     Social History:  Flooring in bedroom: wood Pets: cat and dog Tobacco use/exposure: none Job: Engineer, production    Medication List:  Allergies as of 08/27/2023   No Known Allergies      Medication List        Accurate as of August 27, 2023 11:01 AM. If you have any questions, ask your nurse or doctor.          buPROPion 150 MG 24 hr tablet Commonly known as: Wellbutrin XL Take 1 tablet (150 mg total) by mouth daily.   EPINEPHrine 0.3 mg/0.3 mL Soaj injection Commonly known as: EPI-PEN Inject 0.3 mg into the muscle as needed for anaphylaxis.   escitalopram 20 MG tablet Commonly known as: Lexapro Take 1 tablet (20 mg total) by mouth daily.   hydrOXYzine 25 MG capsule Commonly known as: VISTARIL Take 1 capsule (25 mg total) by mouth every 8 (eight) hours as needed.         REVIEW OF SYSTEMS: Pertinent positives and negatives discussed in HPI.   Objective:   Physical Exam: BP 110/66   Pulse 100   Temp 98.2 F (36.8 C) (Temporal)   Resp 18   Ht 5' 2.72" (1.593 m)   Wt 150 lb 3.2 oz (68.1 kg)   SpO2 97%   BMI 26.85 kg/m  Body mass index is 26.85 kg/m. GEN: alert, well developed HEENT: clear conjunctiva, TM grey and translucent, nose with + mild inferior turbinate hypertrophy, pink nasal mucosa, slight clear rhinorrhea, no cobblestoning HEART: regular rate and rhythm, no murmur LUNGS: clear to auscultation bilaterally, no coughing, unlabored respiration ABDOMEN: soft, non distended  SKIN: no rashes or lesions  Reviewed:  07/11/2023: seen by NP Lonna Cobb Polanco for depression/anxiety and hives/swelling.  About 3-4 times a year.  Unclear etiology. Referred to Allergy. Given Epipen.    05/16/2023: seen by PCP for anxiety, fatigue and recurrent depression.  Started on Wellbutrin. Low Vit D , started on supplement. Normal B12 and folate.  03/15/2023: seen by Felton Clinton at [facility] for anxiety/depression.  Discussed coping skills though motivational interviewing and supportive counseling.   Skin Testing:  Skin prick testing was placed, which includes  aeroallergens/foods, histamine control, and saline control.  Verbal consent was obtained prior to placing test.  Patient tolerated procedure well.  Allergy testing results were read and interpreted by myself, documented by clinical staff. Adequate positive and negative control.  Results discussed with patient/family.  Airborne Adult Perc - 08/27/23 1024     Time Antigen Placed 1025    Allergen Manufacturer Waynette Buttery    Location Back    Number of Test 55    1. Control-Buffer 50% Glycerol Negative    2. Control-Histamine 3+    3. Bahia 3+    4. [place] 3+    5. Johnson Negative    6. Kentucky Blue 2+    7. Meadow Fescue Negative    8. Perennial Rye 2+    9. Timothy Negative    10. Ragweed Mix 3+    11. Cocklebur 2+    12. Plantain,  English Negative    13. Baccharis Negative    14. Dog Fennel Negative    15. Russian Thistle 3+    16. Lamb's Quarters 3+    17. Sheep Sorrell 3+    18. Rough Pigweed 3+    19. Marsh Elder, Rough 3+    20. Mugwort, Common 3+    21. Box, Elder 3+    22. Cedar, red 3+    23. Sweet Gum Negative    24. Pecan Pollen 2+    25. Pine Mix Negative    26. Walnut, Black Pollen 3+    27. Red Mulberry 2+    28. Ash Mix 3+    29. Birch Mix Negative    30. Beech American 2+    31. Cottonwood, Eastern 3+    32. Hickory, White 3+    33. Maple Mix Negative    34. Oak, Guinea[place]issau Mix 3+    35. Sycamore Eastern 3+    36. Alternaria Alternata 2+    37. Cladosporium Herbarum Negative    38. Aspergillus Mix Negative    39. Penicillium Mix Negative    40. Bipolaris Sorokiniana (Helminthosporium) Negative    41. Drechslera Spicifera (Curvularia) 3+    42. Mucor Plumbeus Negative    43. Fusarium Moniliforme 3+    44. Aureobasidium Pullulans (pullulara) 3+    45. Rhizopus Oryzae Negative    46. Botrytis Cinera 2+    47. Epicoccum Nigrum Negative    48. Phoma Betae 3+    49. Dust Mite Mix 3+    50. Cat Hair 10,000 BAU/ml Negative    51.  Dog Epithelia Negative     52. Mixed Feathers Negative    53. Horse Epithelia Negative    54. Cockroach, German Negative    55. Tobacco Leaf Negative             Food Adult Perc - 08/27/23 1000     Time Antigen Placed 1025    Allergen Manufacturer Waynette Buttery    Location Back  Number of allergen test 13    1. Peanut Negative    2. Soybean Negative    3. Wheat Negative    4. Sesame Negative    5. Milk, Cow Negative    6. Casein Negative    7. Egg White, Chicken Negative    8. Shellfish Mix Negative    9. Fish Mix Negative    10. Cashew Negative    11. Walnut Food Negative    12. Almond Negative    13. Hazelnut Negative               Assessment:   1. Seasonal allergic rhinitis due to pollen   2. Allergic rhinitis caused by mold   3. Allergic rhinitis due to dust mite   4. Pollen-food allergy, initial encounter   5. Allergic urticaria     Plan/Recommendations:  Allergic Rhinitis: - Due to turbinate hypertrophy, seasonal symptoms and unresponsive to over the counter meds, performed skin testing to identify aeroallergen triggers.   - Positive skin test 08/2023: trees, grasses, weeds, molds, dust mites  - Avoidance measures discussed. - Use Azelastine 1-2 sprays each nostril twice daily as needed for runny nose, drainage, sneezing, congestion. Aim upward and outward. - Use Zyrtec 10 mg daily.  - Consider allergy shots as long term control of your symptoms by teaching your immune system to be more tolerant of your allergy triggers  Urticaria (Hives): - At this time etiology of hives and swelling is unknown. Hives can be caused by a variety of different triggers including illness/infection, exercise, pressure, vibrations, extremes of temperature to name a few however majority of the time there is no identifiable trigger.  - SPT 08/2023: positive to multiple aeroallergens; negative to common foods.  -Start Zyrtec 10mg  daily.   -If no improvement in 2-3 days, increase to Zyrtec 10mg  twice daily.    -If no improvement in 2-3 days, add Pepcid 20mg  twice daily and continue Zyrtec 10mg  twice daily.  Oral Allergy Syndrome- Bananas  - These symptoms are typically not life-threatening and are because of a cross reaction between a pollen you are allergic to, and to a protein in specific foods (such as fresh fruits, vegetables, and nuts). - If you can eat these things and tolerate the symptoms, it is fine to continue to do so.  If not, you may avoid these fresh fruits and vegetables.   - Heating these foods, buying them canned, and peeling these foods should allow them to be consumed without symptoms or with less symptoms.   ALLERGEN AVOIDANCE MEASURES   Dust Mites Use central air conditioning and heat; and change the filter monthly.  Pleated filters work better than mesh filters.  Electrostatic filters may also be used; wash the filter monthly.  Window air conditioners may be used, but do not clean the air as well as a central air conditioner.  Change or wash the filter monthly. Keep windows closed.  Do not use attic fans.   Encase the mattress, box springs and pillows with zippered, dust proof covers. Wash the bed linens in hot water weekly.   Remove carpet, especially from the bedroom. Remove stuffed animals, throw pillows, dust ruffles, heavy drapes and other items that collect dust from the bedroom. Do not use a humidifier.   Use wood, vinyl or leather furniture instead of cloth furniture in the bedroom. Keep the indoor humidity at 30 - 40%.  Monitor with a humidity gauge.  Molds - Indoor avoidance Use air conditioning to  reduce indoor humidity.  Do not use a humidifier. Keep indoor humidity at 30 - 40%.  Use a dehumidifier if needed. In the bathroom use an exhaust fan or open a window after showering.  Wipe down damp surfaces after showering.  Clean bathrooms with a mold-killing solution (diluted bleach, or products like Tilex, etc) at least once a month. In the kitchen use an exhaust  fan to remove steam from cooking.  Throw away spoiled foods immediately, and empty garbage daily.  Empty water pans below self-defrosting refrigerators frequently. Vent the clothes dryer to the outside. Limit indoor houseplants; mold grows in the dirt.  No houseplants in the bedroom. Remove carpet from the bedroom. Encase the mattress and box springs with a zippered encasing.  Molds - Outdoor avoidance Avoid being outside when the grass is being mowed, or the ground is tilled. Avoid playing in leaves, pine straw, hay, etc.  Dead plant materials contain mold. Avoid going into barns or grain storage areas. Remove leaves, clippings and compost from around the home.  Pollen Avoidance Pollen levels are highest during the mid-day and afternoon.  Consider this when planning outdoor activities. Avoid being outside when the grass is being mowed, or wear a mask if the pollen-allergic person must be the one to mow the grass. Keep the windows closed to keep pollen outside of the home. Use an air conditioner to filter the air. Take a shower, wash hair, and change clothing after working or playing outdoors during pollen season.      Return in about 6 weeks (around 10/08/2023).  Alesia Morin, MD [facility]

## 2023-08-27 NOTE — Patient Instructions (Addendum)
Allergic Rhinitis: - Positive skin test 08/2023: trees, grasses, weeds, molds, dust mites  - Avoidance measures discussed. - Use Azelastine 1-2 sprays each nostril twice daily as needed for runny nose, drainage, sneezing, congestion. Aim upward and outward. - Use Zyrtec 10 mg daily.  - Consider allergy shots as long term control of your symptoms by teaching your immune system to be more tolerant of your allergy triggers  Urticaria (Hives): - At this time etiology of hives and swelling is unknown. Hives can be caused by a variety of different triggers including illness/infection, exercise, pressure, vibrations, extremes of temperature to name a few however majority of the time there is no identifiable trigger.  - SPT 08/2023: positive to multiple aeroallergens; negative to common foods.  -Start Zyrtec 10mg  daily.   -If no improvement in 2-3 days, increase to Zyrtec 10mg  twice daily.   -If no improvement in 2-3 days, add Pepcid 20mg  twice daily and continue Zyrtec 10mg  twice daily.  Oral Allergy Syndrome- Bananas  - These symptoms are typically not life-threatening and are because of a cross reaction between a pollen you are allergic to, and to a protein in specific foods (such as fresh fruits, vegetables, and nuts). - If you can eat these things and tolerate the symptoms, it is fine to continue to do so.  If not, you may avoid these fresh fruits and vegetables.   - Heating these foods, buying them canned, and peeling these foods should allow them to be consumed without symptoms or with less symptoms.   ALLERGEN AVOIDANCE MEASURES   Dust Mites Use central air conditioning and heat; and change the filter monthly.  Pleated filters work better than mesh filters.  Electrostatic filters may also be used; wash the filter monthly.  Window air conditioners may be used, but do not clean the air as well as a central air conditioner.  Change or wash the filter monthly. Keep windows closed.  Do not use  attic fans.   Encase the mattress, box springs and pillows with zippered, dust proof covers. Wash the bed linens in hot water weekly.   Remove carpet, especially from the bedroom. Remove stuffed animals, throw pillows, dust ruffles, heavy drapes and other items that collect dust from the bedroom. Do not use a humidifier.   Use wood, vinyl or leather furniture instead of cloth furniture in the bedroom. Keep the indoor humidity at 30 - 40%.  Monitor with a humidity gauge.  Molds - Indoor avoidance Use air conditioning to reduce indoor humidity.  Do not use a humidifier. Keep indoor humidity at 30 - 40%.  Use a dehumidifier if needed. In the bathroom use an exhaust fan or open a window after showering.  Wipe down damp surfaces after showering.  Clean bathrooms with a mold-killing solution (diluted bleach, or products like Tilex, etc) at least once a month. In the kitchen use an exhaust fan to remove steam from cooking.  Throw away spoiled foods immediately, and empty garbage daily.  Empty water pans below self-defrosting refrigerators frequently. Vent the clothes dryer to the outside. Limit indoor houseplants; mold grows in the dirt.  No houseplants in the bedroom. Remove carpet from the bedroom. Encase the mattress and box springs with a zippered encasing.  Molds - Outdoor avoidance Avoid being outside when the grass is being mowed, or the ground is tilled. Avoid playing in leaves, pine straw, hay, etc.  Dead plant materials contain mold. Avoid going into barns or grain storage areas. Remove leaves, clippings  and compost from around the home.  Pollen Avoidance Pollen levels are highest during the mid-day and afternoon.  Consider this when planning outdoor activities. Avoid being outside when the grass is being mowed, or wear a mask if the pollen-allergic person must be the one to mow the grass. Keep the windows closed to keep pollen outside of the home. Use an air conditioner to filter the  air. Take a shower, wash hair, and change clothing after working or playing outdoors during pollen season.

## 2023-09-17 ENCOUNTER — Encounter: Payer: Self-pay | Admitting: Family Medicine

## 2023-09-17 ENCOUNTER — Ambulatory Visit: Payer: Medicaid Other | Admitting: Family Medicine

## 2023-09-17 VITALS — BP 110/68 | HR 78 | Ht 64.0 in | Wt 155.0 lb

## 2023-09-17 DIAGNOSIS — F909 Attention-deficit hyperactivity disorder, unspecified type: Secondary | ICD-10-CM

## 2023-09-17 MED ORDER — LISDEXAMFETAMINE DIMESYLATE 10 MG PO CAPS: 10.0000 mg | ORAL_CAPSULE | Freq: Every day | ORAL | 0 refills | Status: DC

## 2023-09-17 NOTE — Patient Instructions (Signed)
        Great to see you today.  I have refilled the medication(s) we provide.    - Please take medications as prescribed. - Follow up with your primary health provider if any health concerns arises. - If symptoms worsen please contact your primary care provider and/or visit the emergency department.  

## 2023-09-17 NOTE — Assessment & Plan Note (Addendum)
Patient was seen by [facility] QBActivity result was 2.9- suggest mild or moderate hyperactivity, which might be consistent with ADHD Scan patient medical records of QB test in patients media files. Started patient on Vyvanse 10 mg once daily Follow up in 6 weeks for medication management.

## 2023-09-17 NOTE — Progress Notes (Addendum)
Patient Office Visit   Subjective   Patient ID: Linda Russell, female    DOB: 10/01/2001  Age: 22 y.o. MRN: [id number]  CC:  Chief Complaint  Patient presents with   Follow-up    Patient states she had a test to verify that she has ADHD and would like to start medication.     HPI [place] E Alexiss Iturralde 22 year old female, presents to the clinic for ADHD management.She  has a past medical history of Anxiety, Dehydration, and High triglycerides.For the details of today's visit, please refer to assessment and plan.   HPI    Outpatient Encounter Medications as of 09/17/2023  Medication Sig   azelastine (ASTELIN) 0.1 % nasal spray Place 1 spray into both nostrils 2 (two) times daily as needed. Use in each nostril as directed   cetirizine (ZYRTEC ALLERGY) 10 MG tablet Take 1 tablet (10 mg total) by mouth 2 (two) times daily as needed for allergies (hives).   EPINEPHrine 0.3 mg/0.3 mL IJ SOAJ injection Inject 0.3 mg into the muscle as needed for anaphylaxis.   escitalopram (LEXAPRO) 20 MG tablet Take 1 tablet (20 mg total) by mouth daily.   famotidine (PEPCID) 20 MG tablet Take 1 tablet (20 mg total) by mouth 2 (two) times daily as needed (hives).   hydrOXYzine (VISTARIL) 25 MG capsule Take 1 capsule (25 mg total) by mouth every 8 (eight) hours as needed.   lisdexamfetamine (VYVANSE) 10 MG capsule Take 1 capsule (10 mg total) by mouth daily.   [DISCONTINUED] buPROPion (WELLBUTRIN XL) 150 MG 24 hr tablet Take 1 tablet (150 mg total) by mouth daily.   No facility-administered encounter medications on file as of 09/17/2023.    No past surgical history on file.  Review of Systems  Constitutional:  Negative for chills and fever.  Eyes:  Negative for blurred vision.  Respiratory:  Negative for shortness of breath.   Cardiovascular:  Negative for chest pain.  Gastrointestinal:  Negative for abdominal pain.  Genitourinary:  Negative for dysuria.  Neurological:  Negative for  dizziness and headaches.  Psychiatric/Behavioral:  Negative for depression and suicidal ideas.       Objective    BP 110/68   Pulse 78   Ht 5\' 4"  (1.626 m)   Wt 155 lb (70.3 kg)   SpO2 97%   BMI 26.61 kg/m   Physical Exam Vitals reviewed.  Constitutional:      General: She is not in acute distress.    Appearance: Normal appearance. She is not ill-appearing, toxic-appearing or diaphoretic.  HENT:     Head: Normocephalic.  Eyes:     General:        Right eye: No discharge.        Left eye: No discharge.     Conjunctiva/sclera: Conjunctivae normal.  Cardiovascular:     Rate and Rhythm: Normal rate.     Pulses: Normal pulses.     Heart sounds: Normal heart sounds.  Pulmonary:     Effort: Pulmonary effort is normal. No respiratory distress.     Breath sounds: Normal breath sounds.  Musculoskeletal:        General: Normal range of motion.     Cervical back: Normal range of motion.  Skin:    General: Skin is warm and dry.     Capillary Refill: Capillary refill takes less than 2 seconds.  Neurological:     General: No focal deficit present.     Mental Status:  She is alert and oriented to person, place, and time.     Coordination: Coordination normal.     Gait: Gait normal.  Psychiatric:        Mood and Affect: Mood normal.        Behavior: Behavior normal.       Assessment & Plan:  Attention deficit hyperactivity disorder (ADHD), unspecified ADHD type Assessment & Plan: Patient was seen by [facility] QBActivity result was 2.9- suggest mild or moderate hyperactivity, which might be consistent with ADHD Scan patient medical records of QB test in patients media files. Started patient on Vyvanse 10 mg once daily Follow up in 6 weeks for medication management.  Orders: -     Lisdexamfetamine Dimesylate; Take 1 capsule (10 mg total) by mouth daily.  Dispense: 30 capsule; Refill: 0    Return in about 6 weeks (around 10/29/2023), or if symptoms  worsen or fail to improve, for ADHD medication managment .   Cruzita Lederer Newman Nip, FNP

## 2023-10-08 ENCOUNTER — Ambulatory Visit: Payer: Medicaid Other | Admitting: Internal Medicine

## 2023-10-08 ENCOUNTER — Encounter: Payer: Self-pay | Admitting: Internal Medicine

## 2023-10-08 VITALS — BP 120/66 | HR 99 | Temp 98.2°F | Resp 16

## 2023-10-08 DIAGNOSIS — J3089 Other allergic rhinitis: Secondary | ICD-10-CM

## 2023-10-08 DIAGNOSIS — L501 Idiopathic urticaria: Secondary | ICD-10-CM

## 2023-10-08 DIAGNOSIS — J302 Other seasonal allergic rhinitis: Secondary | ICD-10-CM

## 2023-10-08 MED ORDER — CETIRIZINE HCL 10 MG PO TABS: 10.0000 mg | ORAL_TABLET | Freq: Two times a day (BID) | ORAL | 5 refills | Status: DC | PRN

## 2023-10-08 MED ORDER — AZELASTINE HCL 0.1 % NA SOLN: 1.0000 | Freq: Two times a day (BID) | NASAL | 5 refills | Status: DC | PRN

## 2023-10-08 MED ORDER — FLUTICASONE PROPIONATE 50 MCG/ACT NA SUSP: 2.0000 | Freq: Every day | NASAL | 5 refills | Status: DC

## 2023-10-08 NOTE — Patient Instructions (Addendum)
Allergic Rhinitis: - Positive skin test 08/2023: trees, grasses, weeds, molds, dust mites  - Avoidance measures discussed. - Use Flonase 2 sprays each nostril daily.  Aim upward and outward. - Use Azelastine 1-2 sprays each nostril twice daily as needed for runny nose, drainage, sneezing, congestion. Aim upward and outward. - Use Zyrtec 10 mg daily.  - Consider allergy shots as long term control of your symptoms by teaching your immune system to be more tolerant of your allergy triggers.  Discuss cost estimate with insurance company and if this is something you would like to start, please call us back. Will get the vials mixed and Epipen sent.    Idiopathic Urticaria (Hives): - At this time etiology of hives and swelling is unknown. Hives can be caused by a variety of different triggers including illness/infection, exercise, pressure, vibrations, extremes of temperature to name a few however majority of the time there is no identifiable trigger.  - SPT 08/2023: positive to multiple aeroallergens; negative to common foods.  -Start Zyrtec 10mg  daily.   -If no improvement in 2-3 days, increase to Zyrtec 10mg  twice daily.   -If no improvement in 2-3 days, add Pepcid 20mg  twice daily and continue Zyrtec 10mg  twice daily.  Oral Allergy Syndrome- Bananas  - These symptoms are typically not life-threatening and are because of a cross reaction between a pollen you are allergic to, and to a protein in specific foods (such as fresh fruits, vegetables, and nuts). - If you can eat these things and tolerate the symptoms, it is fine to continue to do so.  If not, you may avoid these fresh fruits and vegetables.   - Heating these foods, buying them canned, and peeling these foods should allow them to be consumed without symptoms or with less symptoms.

## 2023-10-08 NOTE — Progress Notes (Signed)
   FOLLOW UP Date of Service/Encounter:  10/08/23   Subjective:  Linda Russell (DOB: 08/10/2001) is a 22 y.o. female who [facility] for follow up for allergic rhinitis and idiopathic urticaria.   History obtained from: chart review and patient. Last visit was with me on 08/27/2023 and at the time was SPT positive to multiple aeroallergens.  Started on Azelastine, Zyrtec for allergic rhinitis and Zyrtec/Pepcid for hives. 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   Since last visit, reports still having trouble with her allergies. Notes frequent congestion, drainage, runny nose.  Using Azelastine PRN and Zyrtec daily with minimal relief.  Has not noted any recurrence of hives.  Controlled on Zyrtec daily.  Has not needed Pepcid.    Past Medical History: Past Medical History:  Diagnosis Date   Anxiety    Dehydration    High triglycerides     Objective:  BP 120/66   Pulse 99   Temp 98.2 F (36.8 C)   Resp 16   SpO2 99%  There is no height or weight on file to calculate BMI. Physical Exam: GEN: alert, well developed HEENT: clear conjunctiva,  nose with mild inferior turbinate hypertrophy, pink nasal mucosa, + clear rhinorrhea, + cobblestoning HEART: regular rate and rhythm, no murmur LUNGS: clear to auscultation bilaterally, no coughing, unlabored respiration SKIN: no rashes or lesions  Assessment:   1. Seasonal and perennial allergic rhinitis   2. Chronic idiopathic urticaria     Plan/Recommendations:  Allergic Rhinitis: - Uncontrolled, discussed starting Flonase and AIT.  - Positive skin test 08/2023: trees, grasses, weeds, molds, dust mites  - Avoidance measures discussed. - Use Flonase 2 sprays each nostril daily.  Aim upward and outward. - Use Azelastine 1-2 sprays each nostril twice daily as needed for runny nose, drainage, sneezing, congestion. Aim upward and outward. - Use Zyrtec 10 mg daily.  -  Consider allergy shots as long term control of your symptoms by teaching your immune system to be more tolerant of your allergy triggers.  Discuss cost estimate with insurance company and if this is something you would like to start, please call us back.  Given information on AIT and discussed risks/benefits. Will get the vials mixed and Epipen sent.    Idiopathic Urticaria (Hives): - Controlled  - At this time etiology of hives and swelling is unknown. Hives can be caused by a variety of different triggers including illness/infection, exercise, pressure, vibrations, extremes of temperature to name a few however majority of the time there is no identifiable trigger.  - SPT 08/2023: positive to multiple aeroallergens; negative to common foods.  -Continue Zyrtec 10mg  daily.   -If no improvement in 2-3 days, increase to Zyrtec 10mg  twice daily.   -If no improvement in 2-3 days, add Pepcid 20mg  twice daily and continue Zyrtec 10mg  twice daily.   Return in about 3 months (around 01/08/2024).  Alesia Morin, MD [facility]

## 2023-11-10 NOTE — Patient Instructions (Addendum)
        Great to see you today.  I have refilled the medication(s) we provide.    - Please take medications as prescribed. - Follow up with your primary health provider if any health concerns arises. - If symptoms worsen please contact your primary care provider and/or visit the emergency department.  

## 2023-11-10 NOTE — Progress Notes (Unsigned)
   Established Patient Office Visit   Subjective  Patient ID: Linda Russell, female    DOB: 2001-06-08  Age: 22 y.o. MRN: [id number]  No chief complaint on file.   She  has a past medical history of Anxiety, Dehydration, and High triglycerides.  HPI  ROS    Objective:     There were no vitals taken for this visit. {Vitals History (Optional):23777}  Physical Exam   No results found for any visits on 11/12/23.  The ASCVD Risk score (Arnett DK, et al., 2019) failed to calculate for the following reasons:   The 2019 ASCVD risk score is only valid for ages 50 to 24    Assessment & Plan:  There are no diagnoses linked to this encounter.  No follow-ups on file.   Cruzita Lederer Newman Nip, FNP

## 2023-11-12 ENCOUNTER — Ambulatory Visit: Payer: Medicaid Other | Admitting: Family Medicine

## 2023-11-12 ENCOUNTER — Encounter: Payer: Self-pay | Admitting: Family Medicine

## 2023-11-12 VITALS — BP 101/60 | HR 80 | Ht 64.0 in | Wt 158.1 lb

## 2023-11-12 DIAGNOSIS — F909 Attention-deficit hyperactivity disorder, unspecified type: Secondary | ICD-10-CM | POA: Diagnosis not present

## 2023-11-12 DIAGNOSIS — Z23 Encounter for immunization: Secondary | ICD-10-CM

## 2023-11-12 MED ORDER — LISDEXAMFETAMINE DIMESYLATE 40 MG PO CAPS: 40.0000 mg | ORAL_CAPSULE | ORAL | 0 refills | Status: DC

## 2023-11-12 NOTE — Assessment & Plan Note (Signed)
Medication Adjustment: Increase Vyvanse to 40 mg once daily to improve symptom control. Discussed potential side effects, including insomnia, decreased appetite, and mood changes, and advised the patient to report any adverse reactions. Follow-Up: Schedule a follow-up visit in 3 months to reassess response to the new dose and evaluate for any side effects or further adjustments needed. Supportive Measures: Encouraged continued use of organizational tools and techniques to assist with daily functioning. Education: Reviewed the importance of taking medication as prescribed, monitoring for side effects, and maintaining regular follow-ups for effective management.

## 2023-12-10 ENCOUNTER — Encounter: Payer: Self-pay | Admitting: Family Medicine

## 2023-12-11 NOTE — Telephone Encounter (Signed)
[facility] [phone]  Or [facility]  [phone]

## 2023-12-31 ENCOUNTER — Other Ambulatory Visit: Payer: Self-pay | Admitting: Family Medicine

## 2024-01-01 MED ORDER — LISDEXAMFETAMINE DIMESYLATE 40 MG PO CAPS: 40.0000 mg | ORAL_CAPSULE | ORAL | 0 refills | Status: DC

## 2024-01-07 ENCOUNTER — Other Ambulatory Visit: Payer: Self-pay

## 2024-01-07 ENCOUNTER — Ambulatory Visit: Payer: Medicaid Other | Admitting: Internal Medicine

## 2024-01-07 VITALS — BP 100/60 | HR 86 | Temp 98.6°F | Ht 64.0 in | Wt 152.4 lb

## 2024-01-07 DIAGNOSIS — L501 Idiopathic urticaria: Secondary | ICD-10-CM

## 2024-01-07 DIAGNOSIS — J302 Other seasonal allergic rhinitis: Secondary | ICD-10-CM | POA: Diagnosis not present

## 2024-01-07 DIAGNOSIS — J3089 Other allergic rhinitis: Secondary | ICD-10-CM | POA: Diagnosis not present

## 2024-01-07 MED ORDER — AZELASTINE HCL 0.1 % NA SOLN: 1.0000 | Freq: Two times a day (BID) | NASAL | 5 refills | Status: AC | PRN

## 2024-01-07 MED ORDER — CETIRIZINE HCL 10 MG PO TABS: 10.0000 mg | ORAL_TABLET | Freq: Two times a day (BID) | ORAL | 5 refills | Status: AC | PRN

## 2024-01-07 MED ORDER — FLUTICASONE PROPIONATE 50 MCG/ACT NA SUSP: 2.0000 | Freq: Every day | NASAL | 5 refills | Status: AC

## 2024-01-07 NOTE — Patient Instructions (Addendum)
Allergic Rhinitis: - Positive skin test 08/2023: trees, grasses, weeds, molds, dust mites  - Avoidance measures discussed. - Use Flonase 2 sprays each nostril daily.  Aim upward and outward. - Use Azelastine 1-2 sprays each nostril twice daily as needed for runny nose, drainage, sneezing, congestion. Aim upward and outward. - Use Zyrtec 10 mg daily.  - Consider allergy shots as long term control of your symptoms by teaching your immune system to be more tolerant of your allergy triggers.  Discuss cost estimate with insurance company and if this is something you would like to start, please call us back. Will get the vials mixed and Epipen sent.    Idiopathic Urticaria (Hives): - At this time etiology of hives and swelling is unknown. Hives can be caused by a variety of different triggers including illness/infection, exercise, pressure, vibrations, extremes of temperature to name a few however majority of the time there is no identifiable trigger.  - SPT 08/2023: positive to multiple aeroallergens; negative to common foods.  - Continue Zyrtec 10mg  daily.   -If no improvement in 2-3 days, increase to Zyrtec 10mg  twice daily.    Oral Allergy Syndrome- Bananas  - These symptoms are typically not life-threatening and are because of a cross reaction between a pollen you are allergic to, and to a protein in specific foods (such as fresh fruits, vegetables, and nuts). - If you can eat these things and tolerate the symptoms, it is fine to continue to do so.  If not, you may avoid these fresh fruits and vegetables.   - Heating these foods, buying them canned, and peeling these foods should allow them to be consumed without symptoms or with less symptoms.

## 2024-01-07 NOTE — Progress Notes (Signed)
FOLLOW UP Date of Service/Encounter:  01/07/24   Subjective:  Linda Russell (DOB: 04-18-2001) is a 23 y.o. female who [facility] for follow up for urticaria and allergic rhinitis.   History obtained from: chart review and patient. Last visit was with me on 10/08/2023 and at the time, allergies were uncontrolled despite use of Azelastine/Zyrtec; discussed AIT.  No hives.  Since last visit, she reports she was doing well while on medications but has noted recent worsening with congestion and drainage as she has run out and not picked up refills. Previously was using Flonase and Zyrtec but currently off everything.  Has not discussed AIT with insurance.   Reports skin is doing well. Not much issues with frequent hives. Has not needed Pepcid, usually does well on Zyrtec.   Past Medical History: Past Medical History:  Diagnosis Date   Anxiety    Dehydration    High triglycerides     Objective:  BP 100/60   Pulse 86   Temp 98.6 F (37 C)   Ht 5\' 4"  (1.626 m)   Wt 152 lb 6.4 oz (69.1 kg)   SpO2 98%   BMI 26.16 kg/m  Body mass index is 26.16 kg/m. Physical Exam: GEN: alert, well developed HEENT: clear conjunctiva, nose with mild inferior turbinate hypertrophy, pink nasal mucosa, + clear rhinorrhea, no cobblestoning HEART: regular rate and rhythm, no murmur LUNGS: clear to auscultation bilaterally, no coughing, unlabored respiration SKIN: no rashes or lesions  Assessment:   1. Seasonal and perennial allergic rhinitis   2. Chronic idiopathic urticaria     Plan/Recommendations:  Allergic Rhinitis: - Uncontrolled, discussed restarting allergy medications and consider AIT.  - Positive skin test 08/2023: trees, grasses, weeds, molds, dust mites  - Avoidance measures discussed. - Use Flonase 2 sprays each nostril daily.  Aim upward and outward. - Use Azelastine 1-2 sprays each nostril twice daily as needed for runny nose,  drainage, sneezing, congestion. Aim upward and outward. - Use Zyrtec 10 mg daily.  - Consider allergy shots as long term control of your symptoms by teaching your immune system to be more tolerant of your allergy triggers.  Discuss cost estimate with insurance company and if this is something you would like to start, please call us back. Will get the vials mixed and Epipen sent.    Idiopathic Urticaria (Hives): - Controlled  - At this time etiology of hives and swelling is unknown. Hives can be caused by a variety of different triggers including illness/infection, exercise, pressure, vibrations, extremes of temperature to name a few however majority of the time there is no identifiable trigger.  - SPT 08/2023: positive to multiple aeroallergens; negative to common foods.  - Continue Zyrtec 10mg  daily.   -If no improvement in 2-3 days, increase to Zyrtec 10mg  twice daily.    Oral Allergy Syndrome- Bananas  - These symptoms are typically not life-threatening and are because of a cross reaction between a pollen you are allergic to, and to a protein in specific foods (such as fresh fruits, vegetables, and nuts). - If you can eat these things and tolerate the symptoms, it is fine to continue to do so.  If not, you may avoid these fresh fruits and vegetables.   - Heating these foods, buying them canned, and peeling these foods should allow them to be consumed without symptoms or with less symptoms.     Return in about 6 months (around 07/06/2024).  Alesia Morin, MD [facility]

## 2024-01-24 ENCOUNTER — Other Ambulatory Visit: Payer: Self-pay | Admitting: Family Medicine

## 2024-02-14 ENCOUNTER — Telehealth: Payer: Medicaid Other | Admitting: Family Medicine

## 2024-02-14 DIAGNOSIS — F909 Attention-deficit hyperactivity disorder, unspecified type: Secondary | ICD-10-CM

## 2024-02-14 MED ORDER — LISDEXAMFETAMINE DIMESYLATE 60 MG PO CAPS: 60.0000 mg | ORAL_CAPSULE | ORAL | 0 refills | Status: DC

## 2024-02-14 NOTE — Progress Notes (Signed)
   Virtual Visit via Video Note  I connected with Linda Russell on 02/14/24 at  8:40 AM EST by a video enabled telemedicine application and verified that I am speaking with the correct person using two identifiers.  Patient Location: Home Provider Location: Office/Clinic  I discussed the limitations, risks, security, and privacy concerns of performing an evaluation and management service by video and the availability of in person appointments. I also discussed with the patient that there may be a patient responsible charge related to this service. The patient expressed understanding and agreed to proceed.  Subjective: PCP: Rica Records, FNP  No chief complaint on file.  Patient presents via telehealth for ADHD medication management follow up. For the details of today's visit, please refer to assessment and plan.       ROS: Per HPI  Current Outpatient Medications:    lisdexamfetamine (VYVANSE) 60 MG capsule, Take 1 capsule (60 mg total) by mouth every morning., Disp: 30 capsule, Rfl: 0   azelastine (ASTELIN) 0.1 % nasal spray, Place 1 spray into both nostrils 2 (two) times daily as needed. Use in each nostril as directed, Disp: 30 mL, Rfl: 5   cetirizine (ZYRTEC ALLERGY) 10 MG tablet, Take 1 tablet (10 mg total) by mouth 2 (two) times daily as needed for allergies (or hives)., Disp: 60 tablet, Rfl: 5   EPINEPHrine 0.3 mg/0.3 mL IJ SOAJ injection, Inject 0.3 mg into the muscle as needed for anaphylaxis., Disp: 1 each, Rfl: 3   escitalopram (LEXAPRO) 20 MG tablet, Take 1 tablet by mouth once daily, Disp: 30 tablet, Rfl: 0   famotidine (PEPCID) 20 MG tablet, Take 1 tablet (20 mg total) by mouth 2 (two) times daily as needed (hives)., Disp: 60 tablet, Rfl: 5   fluticasone (FLONASE) 50 MCG/ACT nasal spray, Place 2 sprays into both nostrils daily., Disp: 16 g, Rfl: 5   hydrOXYzine (VISTARIL) 25 MG capsule, Take 1 capsule (25 mg total) by mouth every 8 (eight) hours as  needed., Disp: 30 capsule, Rfl: 0  Observations/Objective: There were no vitals filed for this visit. Physical Exam Patient is alert and no acute distress noted.   Assessment and Plan: Attention deficit hyperactivity disorder (ADHD), unspecified ADHD type Assessment & Plan: Medication Adjustment: Vyvanse increased to 60 mg daily for better symptom control. Discussed possible side effects (insomnia, decreased appetite, mood changes) and advised to report any concerns.  Follow-Up: Reassess response and side effects in 4 months.  Supportive Measures: Encouraged use of organizational tools.  Education: Emphasized medication adherence, side effect monitoring, and regular follow-ups.  Follow up in 4 months.   Other orders -     Lisdexamfetamine Dimesylate; Take 1 capsule (60 mg total) by mouth every morning.  Dispense: 30 capsule; Refill: 0    Follow Up Instructions: No follow-ups on file.   I discussed the assessment and treatment plan with the patient. The patient was provided an opportunity to ask questions, and all were answered. The patient agreed with the plan and demonstrated an understanding of the instructions.   The patient was advised to call back or seek an in-person evaluation if the symptoms worsen or if the condition fails to improve as anticipated.  The above assessment and management plan was discussed with the patient. The patient verbalized understanding of and has agreed to the management plan.   Cruzita Lederer Newman Nip, FNP

## 2024-02-14 NOTE — Assessment & Plan Note (Signed)
 Medication Adjustment: Vyvanse increased to 60 mg daily for better symptom control. Discussed possible side effects (insomnia, decreased appetite, mood changes) and advised to report any concerns.  Follow-Up: Reassess response and side effects in 4 months.  Supportive Measures: Encouraged use of organizational tools.  Education: Emphasized medication adherence, side effect monitoring, and regular follow-ups.  Follow up in 4 months.

## 2024-03-08 ENCOUNTER — Other Ambulatory Visit: Payer: Self-pay | Admitting: Family Medicine

## 2024-04-10 ENCOUNTER — Other Ambulatory Visit: Payer: Self-pay | Admitting: Family Medicine

## 2024-04-11 ENCOUNTER — Telehealth: Payer: Self-pay | Admitting: Pharmacy Technician

## 2024-04-11 ENCOUNTER — Other Ambulatory Visit (HOSPITAL_COMMUNITY): Payer: Self-pay

## 2024-04-11 MED ORDER — ESCITALOPRAM OXALATE 20 MG PO TABS: 20.0000 mg | ORAL_TABLET | Freq: Every day | ORAL | 0 refills | Status: DC

## 2024-04-11 MED ORDER — LISDEXAMFETAMINE DIMESYLATE 60 MG PO CAPS: 60.0000 mg | ORAL_CAPSULE | ORAL | 0 refills | Status: DC

## 2024-04-11 MED ORDER — ESCITALOPRAM OXALATE 20 MG PO TABS: 20.0000 mg | ORAL_TABLET | Freq: Every day | ORAL | 3 refills | Status: DC

## 2024-04-11 NOTE — Telephone Encounter (Signed)
 Pharmacy Patient Advocate Encounter   Received notification from Onbase that prior authorization for LISDEXAMFETAMINE 60MG  CAPULES is required/requested.   Insurance verification completed.   The patient is insured through [facility] .   Per test claim:  BRAND NAME VYVANSE  is preferred by the insurance.  If suggested medication is appropriate, Please send in a new RX and discontinue this one. If not, please advise as to why it's not appropriate so that we may request a Prior Authorization. Please note, some preferred medications may still require a PA.  If the suggested medications have not been trialed and there are no contraindications to their use, the PA will not be submitted, as it will not be approved.  New prescription is not required. Medication can be switched at the pharmacy as brand preferred.

## 2024-06-11 ENCOUNTER — Ambulatory Visit: Admitting: Family Medicine

## 2024-06-14 ENCOUNTER — Encounter: Payer: Self-pay | Admitting: Family Medicine

## 2024-06-17 ENCOUNTER — Other Ambulatory Visit: Payer: Self-pay | Admitting: Family Medicine

## 2024-06-17 ENCOUNTER — Encounter: Payer: Self-pay | Admitting: Family Medicine

## 2024-06-17 MED ORDER — LISDEXAMFETAMINE DIMESYLATE 60 MG PO CAPS: 60.0000 mg | ORAL_CAPSULE | ORAL | 0 refills | Status: AC

## 2024-06-17 MED ORDER — ESCITALOPRAM OXALATE 20 MG PO TABS: 20.0000 mg | ORAL_TABLET | Freq: Every day | ORAL | 3 refills | Status: AC

## 2024-06-17 NOTE — Telephone Encounter (Signed)
 sent

## 2024-06-18 ENCOUNTER — Other Ambulatory Visit (HOSPITAL_COMMUNITY): Payer: Self-pay

## 2024-07-08 NOTE — Patient Instructions (Incomplete)
 Allergic rhinitis Continue allergen avoidance measures directed toward grass pollen, tree pollen, weed pollen, mold, and dust mite as listed below Continue cetirizine  10 mg once a day if needed for runny nose or itch Continue Flonase  1 to 2 sprays in each nostril once a day if needed for stuffy nose.  In the right nostril, point the applicator out toward the right ear. In the left nostril, point the applicator out toward the left ear Consider saline nasal rinses as needed for nasal symptoms. Use this before any medicated nasal sprays for best result Consider allergen immunotherapy if your symptoms are not well-controlled with the treatment plan as listed above  Urticaria Continue cetirizine  10 mg once a day.  You may take an extra dose of cetirizine  10 mg once a day if needed for breakthrough symptoms If your symptoms re-occur, begin a journal of events that occurred for up to 6 hours before your symptoms began including foods and beverages consumed, soaps or perfumes you had contact with, and medications.   Oral allergy  syndrome Continue to avoid foods that bother your mouth such as banana  Call the clinic if this treatment plan is not working well for you  Follow up in *** or sooner if needed.  Reducing Pollen Exposure The American Academy of Allergy , Asthma and Immunology suggests the following steps to reduce your exposure to pollen during allergy  seasons. Do not hang sheets or clothing out to dry; pollen may collect on these items. Do not mow lawns or spend time around freshly cut grass; mowing stirs up pollen. Keep windows closed at night.  Keep car windows closed while driving. Minimize morning activities outdoors, a time when pollen counts are usually at their highest. Stay indoors as much as possible when pollen counts or humidity is high and on windy days when pollen tends to remain in the air longer. Use air conditioning when possible.  Many air conditioners have filters that trap  the pollen spores. Use a HEPA room air filter to remove pollen form the indoor air you breathe.  Control of Mold Allergen Mold and fungi can grow on a variety of surfaces provided certain temperature and moisture conditions exist.  Outdoor molds grow on plants, decaying vegetation and soil.  The major outdoor mold, Alternaria and Cladosporium, are found in very high numbers during hot and dry conditions.  Generally, a late Summer - Fall peak is seen for common outdoor fungal spores.  Rain will temporarily lower outdoor mold spore count, but counts rise rapidly when the rainy period ends.  The most important indoor molds are Aspergillus and Penicillium.  Dark, humid and poorly ventilated basements are ideal sites for mold growth.  The next most common sites of mold growth are the bathroom and the kitchen.  Outdoor [employer] Use air conditioning and keep windows closed Avoid exposure to decaying vegetation. Avoid leaf raking. Avoid grain handling. Consider wearing a face mask if working in moldy areas.  Indoor Mold Control Maintain humidity below 50%. Clean washable surfaces with 5% bleach solution. Remove sources e.g. Contaminated carpets.   Control of Dust Mite Allergen Dust mites play a major role in allergic asthma and rhinitis. They occur in environments with high humidity wherever human skin is found. Dust mites absorb humidity from the atmosphere (ie, they do not drink) and feed on organic matter (including shed human and animal skin). Dust mites are a microscopic type of insect that you cannot see with the naked eye. High levels of dust mites  have been detected from mattresses, pillows, carpets, upholstered furniture, bed covers, clothes, soft toys and any woven material. The principal allergen of the dust mite is found in its feces. A gram of dust may contain 1,000 mites and 250,000 fecal particles. Mite antigen is easily measured in the air during house cleaning activities. Dust mites  do not bite and do not cause harm to humans, other than by triggering allergies/asthma.  Ways to decrease your exposure to dust mites in your home:  1. Encase mattresses, box springs and pillows with a mite-impermeable barrier or cover  2. Wash sheets, blankets and drapes weekly in hot water (130 F) with detergent and dry them in a dryer on the hot setting.  3. Have the room cleaned frequently with a vacuum cleaner and a damp dust-mop. For carpeting or rugs, vacuuming with a vacuum cleaner equipped with a high-efficiency particulate air (HEPA) filter. The dust mite allergic individual should not be in a room which is being cleaned and should wait 1 hour after cleaning before going into the room.  4. Do not sleep on upholstered furniture (eg, couches).  5. If possible removing carpeting, upholstered furniture and drapery from the home is ideal. Horizontal blinds should be eliminated in the rooms where the person spends the most time (bedroom, study, television room). Washable vinyl, roller-type shades are optimal.  6. Remove all non-washable stuffed toys from the bedroom. Wash stuffed toys weekly like sheets and blankets above.  7. Reduce indoor humidity to less than 50%. Inexpensive humidity monitors can be purchased at most hardware stores. Do not use a humidifier as can make the problem worse and are not recommended.  The oral allergy  syndrome (OAS) or pollen-food allergy  syndrome (PFAS) is a relatively common form of food allergy , particularly in adults. It typically occurs in people who have pollen allergies when the immune system sees proteins on the food that look like proteins on the pollen. This results in the allergy  antibody (IgE) binding to the food instead of the pollen. Patients typically report itching and/or mild swelling of the mouth and throat immediately following ingestion of certain uncooked fruits (including nuts) or raw vegetables. Only a very small number of affected  individuals experience systemic allergic reactions, such as anaphylaxis which occurs with true food allergies.

## 2024-07-08 NOTE — Progress Notes (Deleted)
   [address] [place] [place] [place] [address] Dept: [phone]  FOLLOW UP NOTE  Patient ID: Linda Russell Linda Russell, female    DOB: 12-05-2001  Age: 23 y.o. MRN: [id number] Date of Office Visit: 07/09/2024  Assessment  Chief Complaint: No chief complaint on file.  HPI [place] E Linda Russell is a 23 year old female who presents to the clinic for follow-up visit.  She was last seen in this clinic on 01/07/2024 by Dr. Tobie for evaluation of allergic rhinitis, urticaria, and oral allergy  syndrome to banana.  Her last environmental allergy  skin testing on 08/27/2023 was positive to tree pollen, grass pollen, weed pollen, mold, and dust mite.  Most allergenic food skin testing was negative.  Discussed the use of AI scribe software for clinical note transcription with the patient, who gave verbal consent to proceed.  History of Present Illness      Drug Allergies:  No Known Allergies  Physical Exam: There were no vitals taken for this visit.   Physical Exam  Diagnostics:    Assessment and Plan: No diagnosis found.  No orders of the defined types were placed in this encounter.   There are no Patient Instructions on file for this visit.  No follow-ups on file.    Thank you for the opportunity to care for this patient.  Please do not hesitate to contact me with questions.  Arlean Mutter, FNP [facility]

## 2024-07-09 ENCOUNTER — Ambulatory Visit: Payer: Medicaid Other | Admitting: Family Medicine
